# Patient Record
Sex: Female | Born: 1975 | Race: White | Hispanic: No | Marital: Married | Smoking: Current every day smoker
Health system: Southern US, Community
[De-identification: ages and names within clinical notes are randomized; demographics above are authoritative.]

## PROBLEM LIST (undated history)

## (undated) DIAGNOSIS — N83209 Unspecified ovarian cyst, unspecified side: Secondary | ICD-10-CM

## (undated) DIAGNOSIS — D649 Anemia, unspecified: Secondary | ICD-10-CM

## (undated) HISTORY — PX: ECTOPIC PREGNANCY SURGERY: SHX613

---

## 2008-03-05 ENCOUNTER — Inpatient Hospital Stay (HOSPITAL_COMMUNITY): Admission: AD | Admit: 2008-03-05 | Discharge: 2008-03-06 | Payer: Self-pay | Admitting: Obstetrics and Gynecology

## 2008-03-05 ENCOUNTER — Encounter: Payer: Self-pay | Admitting: Emergency Medicine

## 2008-03-05 ENCOUNTER — Ambulatory Visit: Payer: Self-pay | Admitting: Obstetrics and Gynecology

## 2008-03-05 ENCOUNTER — Ambulatory Visit: Payer: Self-pay | Admitting: Diagnostic Radiology

## 2008-06-19 ENCOUNTER — Emergency Department (HOSPITAL_BASED_OUTPATIENT_CLINIC_OR_DEPARTMENT_OTHER): Admission: EM | Admit: 2008-06-19 | Discharge: 2008-06-19 | Payer: Self-pay | Admitting: Emergency Medicine

## 2008-06-19 ENCOUNTER — Ambulatory Visit: Payer: Self-pay | Admitting: Diagnostic Radiology

## 2010-04-22 LAB — COMPREHENSIVE METABOLIC PANEL
ALT: 12 U/L (ref 0–35)
AST: 32 U/L (ref 0–37)
Albumin: 4.4 g/dL (ref 3.5–5.2)
Alkaline Phosphatase: 84 U/L (ref 39–117)
BUN: 14 mg/dL (ref 6–23)
CO2: 24 mEq/L (ref 19–32)
Calcium: 9.6 mg/dL (ref 8.4–10.5)
Chloride: 105 mEq/L (ref 96–112)
Creatinine, Ser: 0.7 mg/dL (ref 0.4–1.2)
GFR calc Af Amer: >60 mL/min (ref >60)
GFR calc non Af Amer: >60 mL/min (ref >60)
Glucose, Bld: 87 mg/dL (ref 70–99)
Potassium: 4.4 mEq/L (ref 3.5–5.1)
Sodium: 141 mEq/L (ref 135–145)
Total Bilirubin: 0.4 mg/dL (ref 0.3–1.2)
Total Protein: 8.1 g/dL (ref 6.0–8.3)

## 2010-04-22 LAB — APTT: aPTT: 33 seconds (ref 24–37)

## 2010-04-22 LAB — URINALYSIS, ROUTINE W REFLEX MICROSCOPIC
Leukocytes, UA: NEGATIVE
Nitrite: NEGATIVE
Specific Gravity, Urine: 1.004 — ABNORMAL LOW (ref 1.005–1.030)
Urobilinogen, UA: 0.2 mg/dL (ref 0.0–1.0)
pH: 7 (ref 5.0–8.0)

## 2010-04-22 LAB — PREGNANCY, URINE: Preg Test, Ur: NEGATIVE

## 2010-04-22 LAB — POCT CARDIAC MARKERS
Myoglobin, poc: 10.4 ng/mL — ABNORMAL LOW (ref 12–200)
Troponin i, poc: <0.05 ng/mL (ref 0.00–0.09)

## 2010-04-22 LAB — DIFFERENTIAL
Basophils Absolute: 0.3 10*3/uL — ABNORMAL HIGH (ref 0.0–0.1)
Basophils Relative: 2 % — ABNORMAL HIGH (ref 0–1)
Monocytes Relative: 7 % (ref 3–12)
Neutro Abs: 7.9 10*3/uL — ABNORMAL HIGH (ref 1.7–7.7)
Neutrophils Relative %: 60 % (ref 43–77)

## 2010-04-22 LAB — CBC
MCHC: 34.1 g/dL (ref 30.0–36.0)
Platelets: 232 10*3/uL (ref 150–400)
RBC: 4.97 MIL/uL (ref 3.87–5.11)
RDW: 18.6 % — ABNORMAL HIGH (ref 11.5–15.5)

## 2010-04-22 LAB — URINE CULTURE: Colony Count: 5000

## 2010-04-22 LAB — URINE MICROSCOPIC-ADD ON

## 2010-04-30 LAB — DIFFERENTIAL
Basophils Absolute: 0 10*3/uL (ref 0.0–0.1)
Eosinophils Absolute: 0.1 10*3/uL (ref 0.0–0.7)
Lymphocytes Relative: 23 % (ref 12–46)
Monocytes Relative: 8 % (ref 3–12)
Neutrophils Relative %: 68 % (ref 43–77)

## 2010-04-30 LAB — COMPREHENSIVE METABOLIC PANEL
Albumin: 4.1 g/dL (ref 3.5–5.2)
Alkaline Phosphatase: 74 U/L (ref 39–117)
BUN: 10 mg/dL (ref 6–23)
CO2: 26 mEq/L (ref 19–32)
Chloride: 107 mEq/L (ref 96–112)
Creatinine, Ser: 0.6 mg/dL (ref 0.4–1.2)
GFR calc non Af Amer: >60 mL/min (ref >60)
Glucose, Bld: 94 mg/dL (ref 70–99)
Potassium: 3.6 mEq/L (ref 3.5–5.1)
Total Bilirubin: 0.4 mg/dL (ref 0.3–1.2)

## 2010-04-30 LAB — RETICULOCYTES
RBC.: 2.99 MIL/uL — ABNORMAL LOW (ref 3.87–5.11)
Retic Count, Absolute: 47.8 10*3/uL (ref 19.0–186.0)
Retic Ct Pct: 1.6 % (ref 0.4–3.1)

## 2010-04-30 LAB — D-DIMER, QUANTITATIVE: D-Dimer, Quant: 0.22 ug/mL-FEU (ref 0.00–0.48)

## 2010-04-30 LAB — CBC
HCT: 21.8 % — ABNORMAL LOW (ref 36.0–46.0)
Hemoglobin: 6.3 g/dL — CL (ref 12.0–15.0)
MCV: 69.3 fL — ABNORMAL LOW (ref 78.0–100.0)
Platelets: 273 10*3/uL (ref 150–400)
WBC: 12 10*3/uL — ABNORMAL HIGH (ref 4.0–10.5)

## 2010-04-30 LAB — URINALYSIS, ROUTINE W REFLEX MICROSCOPIC
Glucose, UA: NEGATIVE mg/dL
Ketones, ur: 15 mg/dL — AB
Leukocytes, UA: NEGATIVE
Protein, ur: 30 mg/dL — AB
Urobilinogen, UA: 1 mg/dL (ref 0.0–1.0)

## 2010-04-30 LAB — VITAMIN B12: Vitamin B-12: 360 pg/mL (ref 211–911)

## 2010-04-30 LAB — GC/CHLAMYDIA PROBE AMP, GENITAL: GC Probe Amp, Genital: NEGATIVE

## 2010-04-30 LAB — IRON AND TIBC
Iron: <10 ug/dL — ABNORMAL LOW (ref 42–135)
UIBC: 404 ug/dL

## 2010-04-30 LAB — URINE MICROSCOPIC-ADD ON

## 2010-04-30 LAB — WET PREP, GENITAL
Clue Cells Wet Prep HPF POC: NONE SEEN
Trich, Wet Prep: NONE SEEN
Yeast Wet Prep HPF POC: NONE SEEN

## 2010-10-08 IMAGING — US US TRANSVAGINAL NON-OB
2 series · 14 of 25 positions shown · non-contrast
Comparison: None

CLINICAL DATA: Vaginal bleeding.

TRANSABDOMINAL AND TRANSVAGINAL ULTRASOUND OF PELVIS
TECHNIQUE: Both transabdominal and transvaginal ultrasound
examinations of the pelvis were performed including evaluation of
the uterus, ovaries, adnexal regions, and pelvic cul-de-sac.

[Series 1: us transvaginal non-ob · 13 of 47 slices shown (1 of 2)]
[im 1/47]
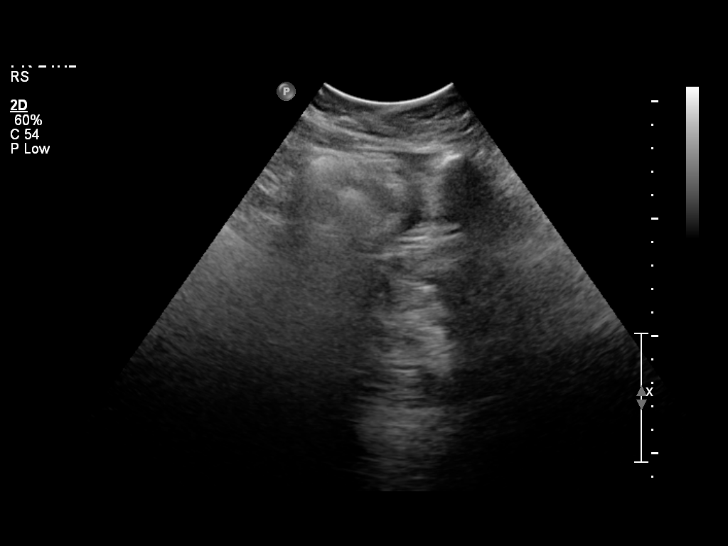
[im 5/47]
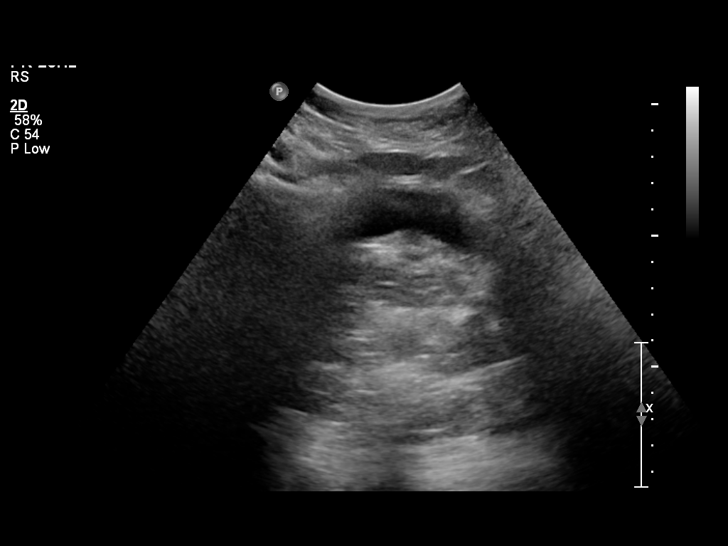
[im 9/47]
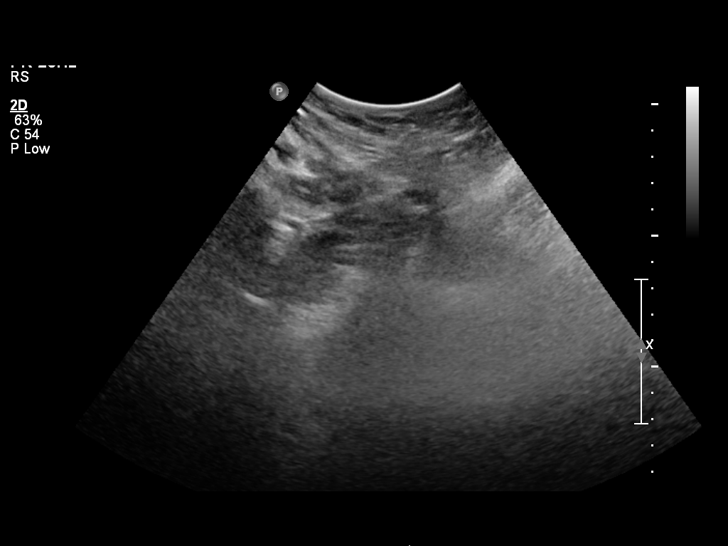
[im 13/47]
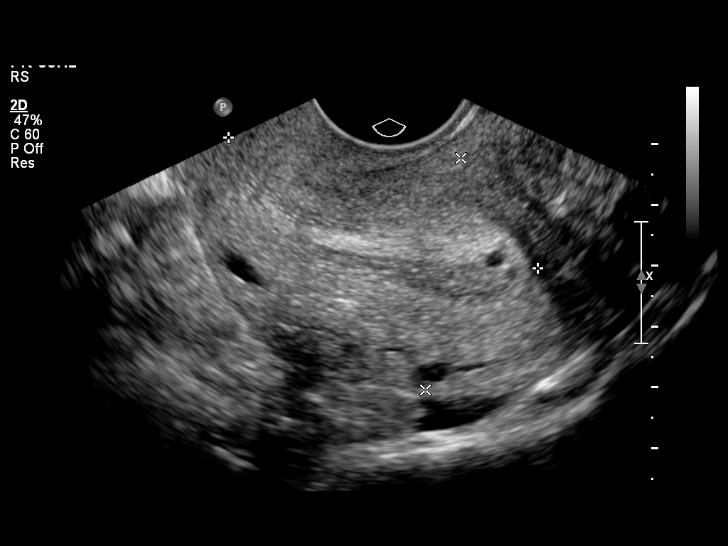
[im 17/47]
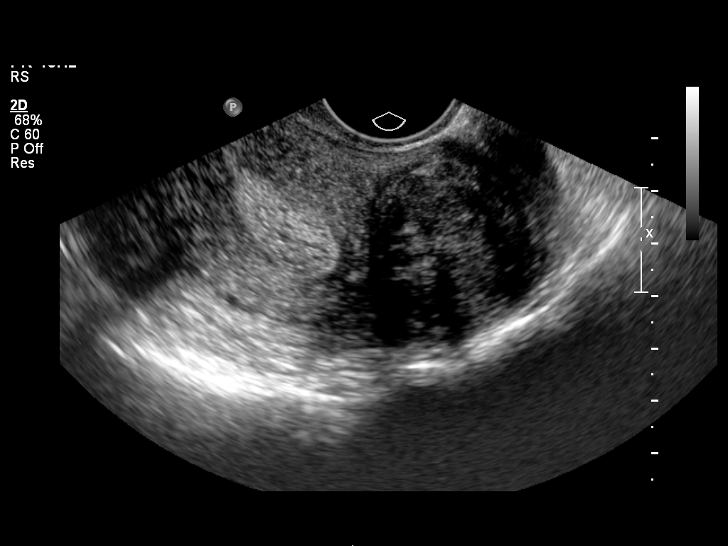
[im 19/47]
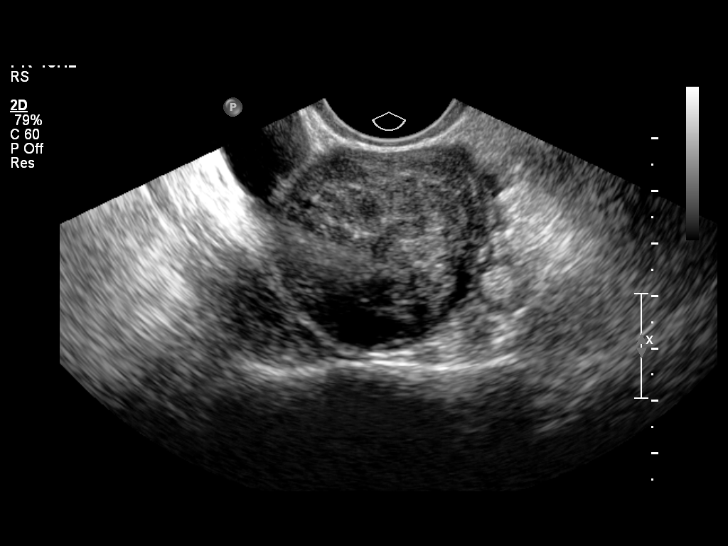
[im 23/47]
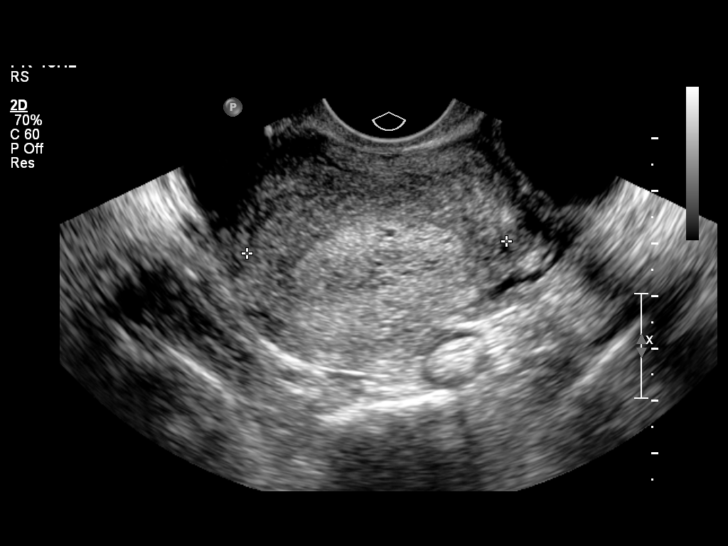
[im 27/47]
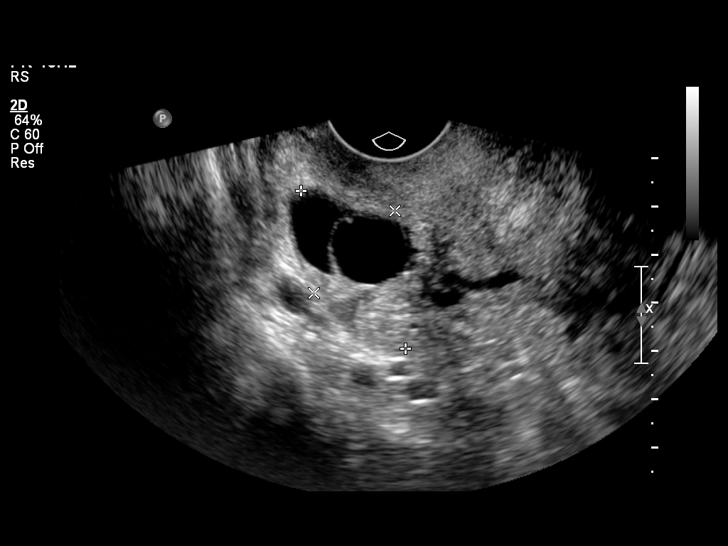
[im 31/47]
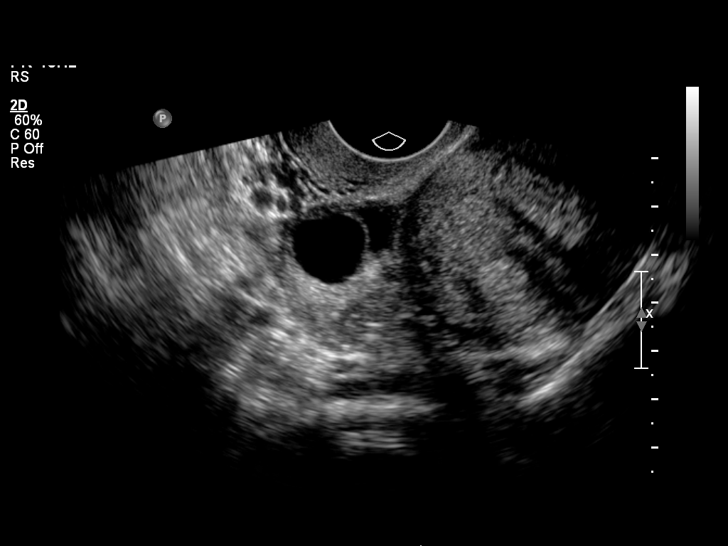
[im 33/47]
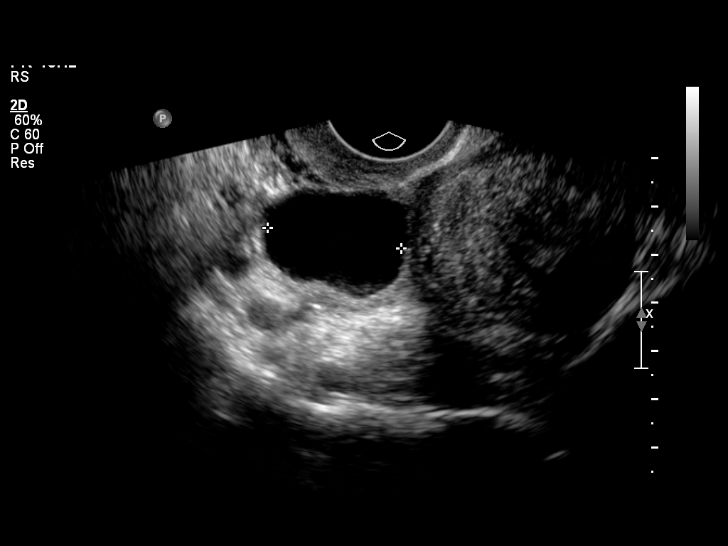
[im 37/47]
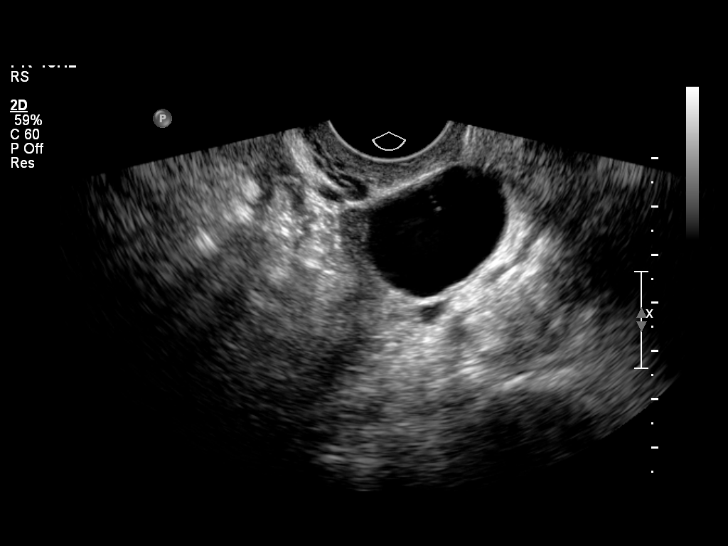
[im 41/47]
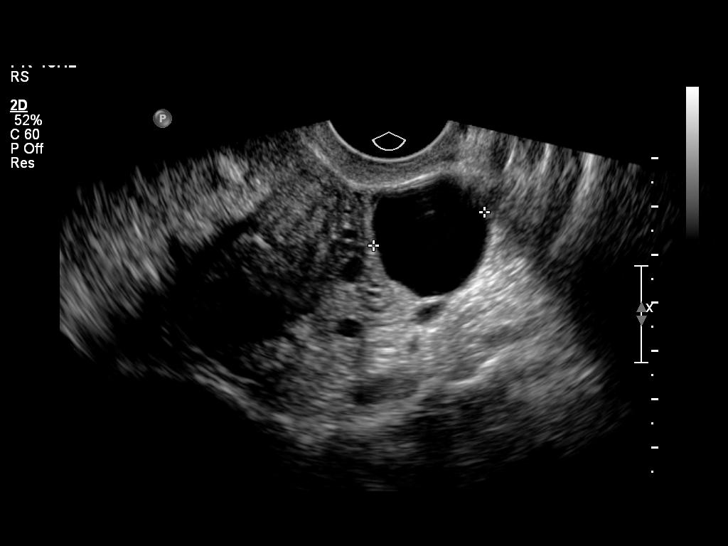
[im 45/47]
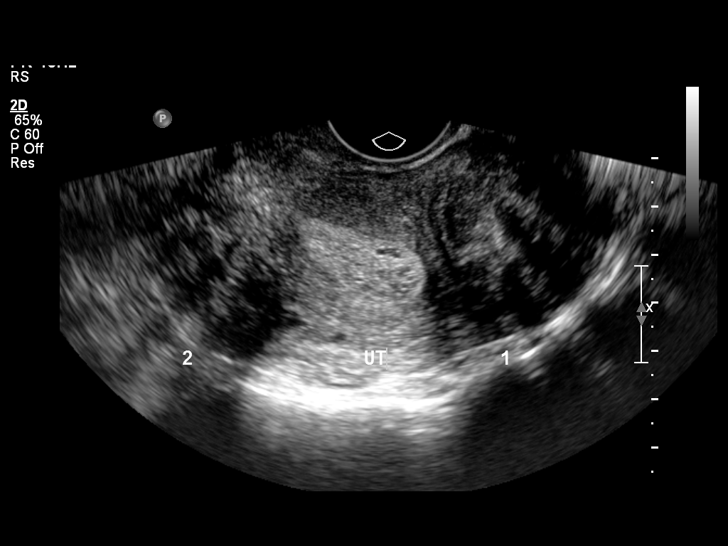

[Series 4: us transvaginal non-ob · 1 of 256 frames shown (2 of 2)]
[frame 129/256]
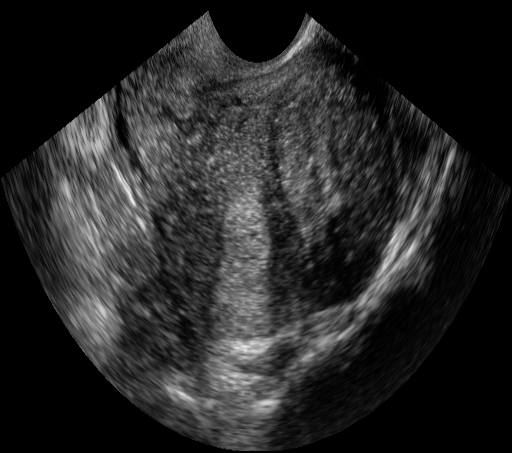

[14 of 25 positions shown; findings below may reference images not displayed]

FINDINGS: The uterus measures 7.1 x 4.7 x 4.9 cm.  There are two
dominant fibroids, one at the fundus measuring 3.4 x 3.2 x 3.4 cm
and one in the lower uterine segment measuring 3.6 x 2.4 x 3.0 cm.
The endometrium is slightly thickened at 9.8 mm.

The right ovary measures 3.9 x 2.4 x 2.4 cm and contains a 2.4 x
1.4 x 2.8 cm simple cyst.  The left ovary measures 4.5 x 2.5 x
cm and contains a 2.7 x 3.0 x 2.4 cm simple cyst.  There is no free
fluid.
IMPRESSION: Two leiomyomas of the uterus.  Slightly thickened and heterogeneous
endometrium.

Dominant ovarian cyst on each side.

No free fluid.

## 2011-01-22 IMAGING — CR DG CHEST 2V
2 series · 2 of 2 positions shown · non-contrast
Comparison: 03/05/2008

CLINICAL DATA: Chest pain shortness of breath.

CHEST - 2 VIEW

[w chest pa]
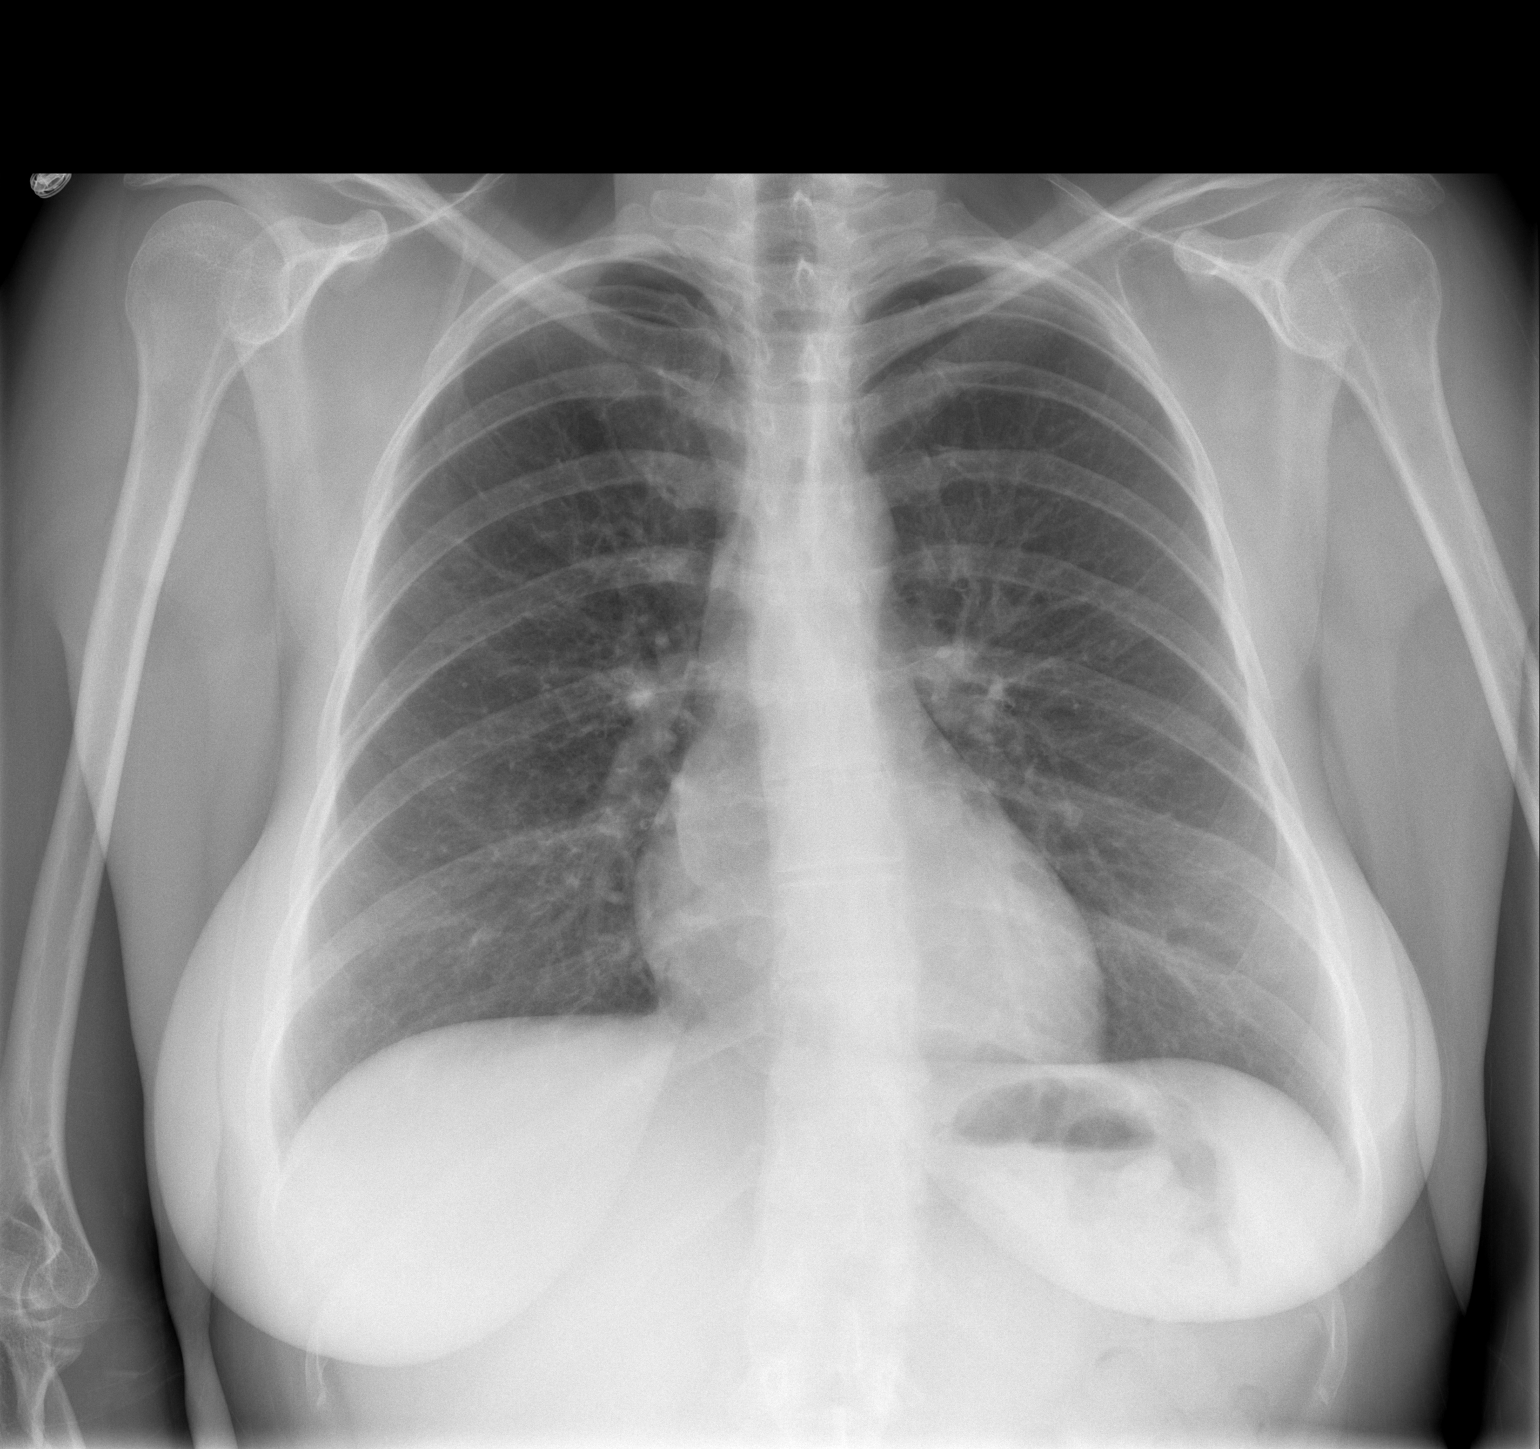

[w chest lat]
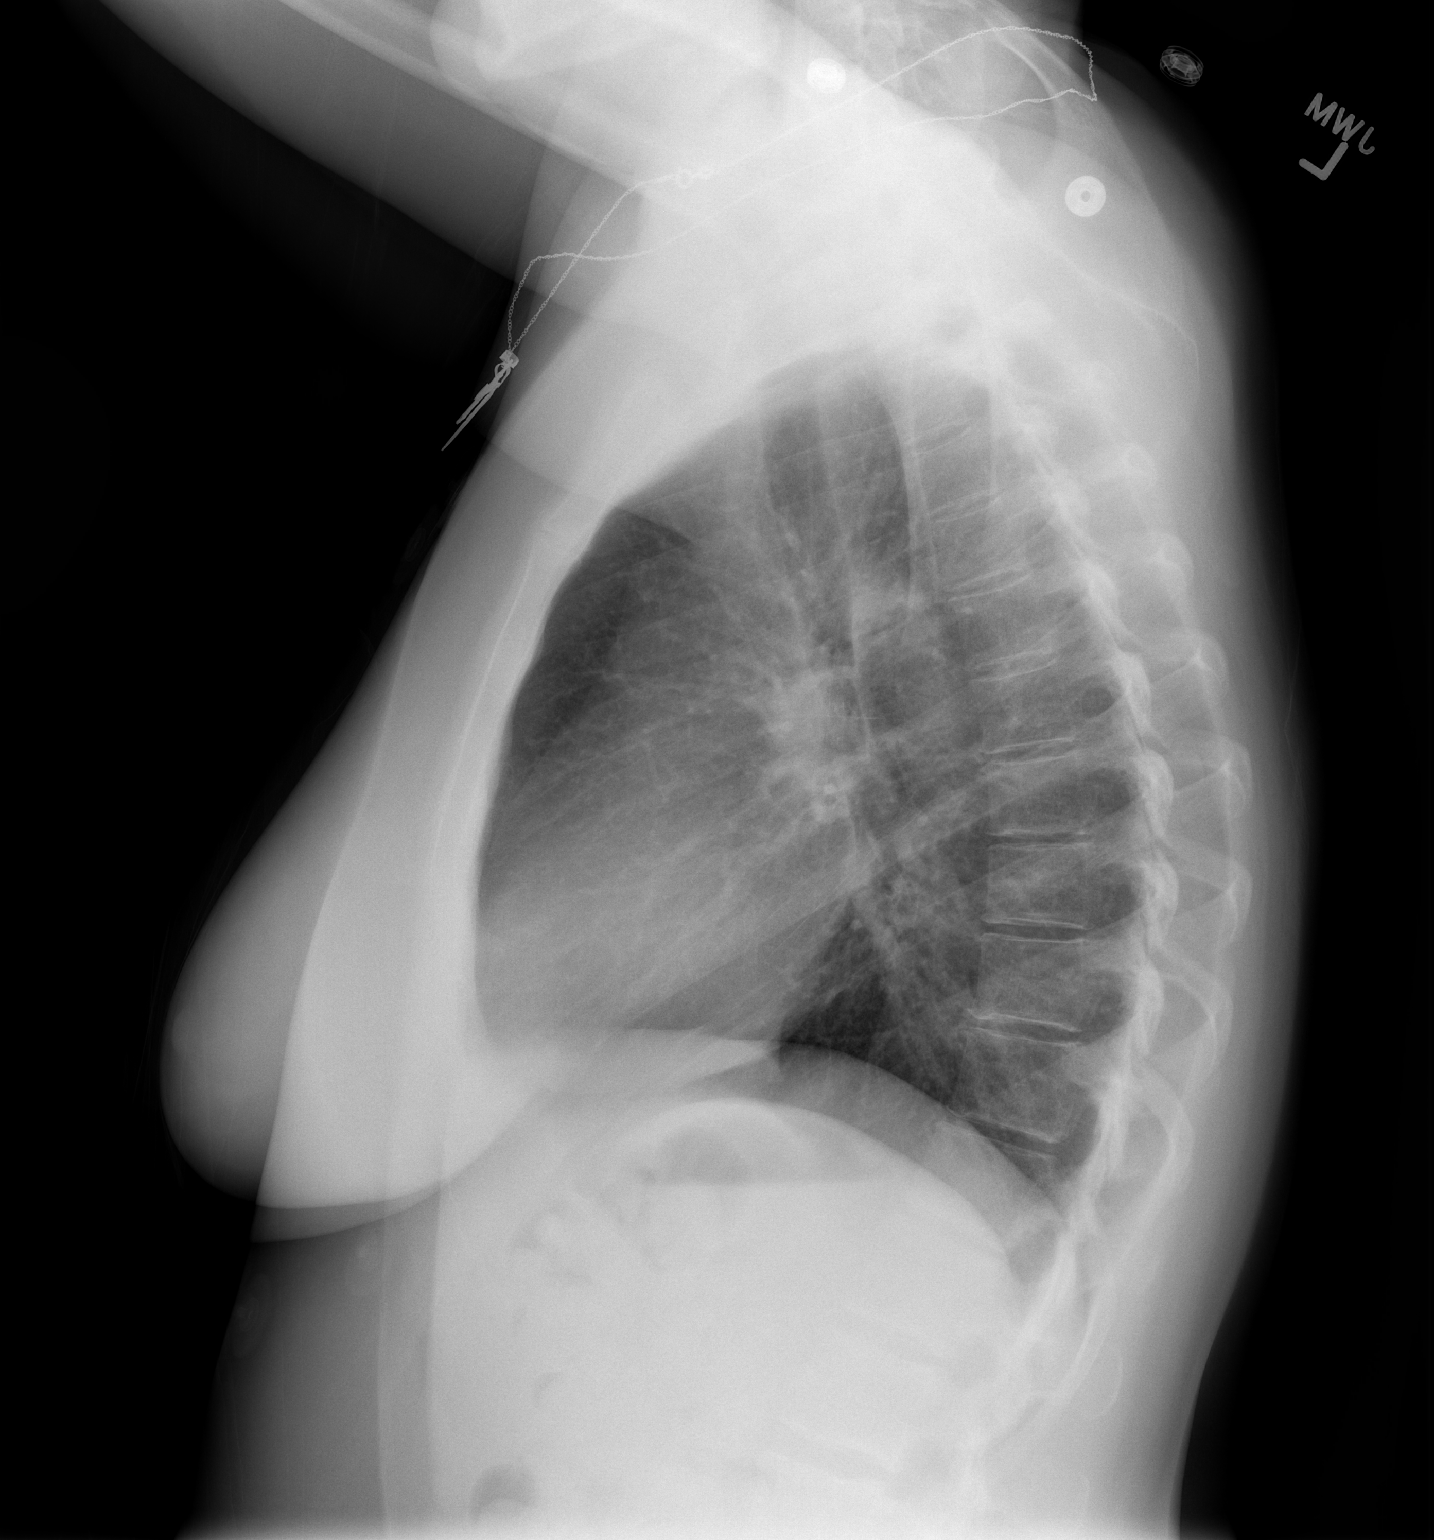

[2 of 2 positions shown; findings below may reference images not displayed]

FINDINGS: The cardiac silhouette, mediastinal and hilar contours
are within normal limits and stable. The lungs are clear.  No
pleural effusions. The bony thorax is intact.
IMPRESSION: No acute cardiopulmonary findings.

## 2013-11-09 ENCOUNTER — Emergency Department (HOSPITAL_BASED_OUTPATIENT_CLINIC_OR_DEPARTMENT_OTHER)
Admission: EM | Admit: 2013-11-09 | Discharge: 2013-11-09 | Disposition: A | Discharge status: Home / Self Care | Payer: 59 | Attending: Emergency Medicine | Admitting: Emergency Medicine

## 2013-11-09 ENCOUNTER — Encounter (HOSPITAL_BASED_OUTPATIENT_CLINIC_OR_DEPARTMENT_OTHER): Payer: Self-pay | Admitting: Emergency Medicine

## 2013-11-09 DIAGNOSIS — Z862 Personal history of diseases of the blood and blood-forming organs and certain disorders involving the immune mechanism: Secondary | ICD-10-CM | POA: Diagnosis not present

## 2013-11-09 DIAGNOSIS — Z720 Tobacco use: Secondary | ICD-10-CM | POA: Insufficient documentation

## 2013-11-09 DIAGNOSIS — Z8742 Personal history of other diseases of the female genital tract: Secondary | ICD-10-CM | POA: Insufficient documentation

## 2013-11-09 DIAGNOSIS — H9202 Otalgia, left ear: Secondary | ICD-10-CM | POA: Diagnosis present

## 2013-11-09 HISTORY — DX: Anemia, unspecified: D64.9

## 2013-11-09 HISTORY — DX: Unspecified ovarian cyst, unspecified side: N83.209

## 2013-11-09 MED ORDER — FLUTICASONE PROPIONATE 50 MCG/ACT NA SUSP: 2.0000 | Freq: Every day | NASAL | Status: AC

## 2013-11-09 MED ORDER — CIPROFLOXACIN-HYDROCORTISONE 0.2-1 % OT SUSP: 3.0000 [drp] | Freq: Two times a day (BID) | OTIC | Status: AC

## 2013-11-09 MED ORDER — IBUPROFEN 800 MG PO TABS
800.0000 mg | ORAL_TABLET | Freq: Once | ORAL | Status: AC
  Administered 2013-11-09: 800 mg via ORAL
  Filled 2013-11-09: qty 1

## 2013-11-09 MED ORDER — IBUPROFEN 800 MG PO TABS: 800.0000 mg | ORAL_TABLET | Freq: Three times a day (TID) | ORAL | Status: AC

## 2013-11-09 NOTE — ED Provider Notes (Signed)
CSN: [id number]     Arrival date & time 11/09/13  2152 History  This chart was scribed for Deliana Smitty CordsK Jarmarcus Wambold-Rasch, MD by Gwenyth Oberatherine Macek, ED Scribe. This patient was seen in room MH03/MH03 and the patient's care was started at 11:20 PM.     Chief Complaint  Patient presents with  . Otalgia   Patient is a 38 y.o. female presenting with ear pain. The history is provided by the patient. No language interpreter was used.  Otalgia Location:  Left Behind ear:  No abnormality Quality:  Aching Onset quality:  Gradual Duration:  1 week Timing:  Constant Progression:  Unchanged Chronicity:  New Context: not foreign body in ear   Relieved by:  Nothing Exacerbated by: QTips and ear candle. Ineffective treatments: ear candle. Associated symptoms: congestion   Associated symptoms: no cough, no fever, no headaches, no hearing loss, no rhinorrhea, no sore throat and no tinnitus   Risk factors: no recent travel    HPI Comments: Zohar Jennye MoccasinCockerham is a 38 y.o. female who presents to the Emergency Department complaining of left ear pain and fullness that started 1 week ago. She states decreased hearing, loss of balance, and congestion as associated symptoms. She has tried an ear candle and Tylenol with no relief to symptoms. Pt used Q-tips 3-4 days ago to clean ears. Pt denies similar symptoms in her right ear pain.  Pt denies fever and cough as associated symptoms.   Past Medical History  Diagnosis Date  . Anemia   . Ovarian cyst rupture    Past Surgical History  Procedure Laterality Date  . Ectopic pregnancy surgery     No family history on file. History  Substance Use Topics  . Smoking status: Current Every Day Smoker  . Smokeless tobacco: Not on file  . Alcohol Use: No   OB History   Grav Para Term Preterm Abortions TAB SAB Ect Mult Living                 Review of Systems  Constitutional: Negative for fever.  HENT: Positive for congestion and ear pain. Negative for hearing loss,  rhinorrhea, sore throat and tinnitus.   Respiratory: Negative for cough.   Neurological: Negative for headaches.  All other systems reviewed and are negative.   Allergies  Bee pollen  Home Medications   Prior to Admission medications   Not on File   BP 147/89  Pulse 76  Temp(Src) 97.8 F (36.6 C) (Oral)  Resp 20  Ht 4\' 11"  (1.499 m)  Wt 179 lb (81.194 kg)  BMI 36.13 kg/m2  SpO2 95% Physical Exam  Nursing note and vitals reviewed. Constitutional: She is oriented to person, place, and time. She appears well-developed and well-nourished. No distress.  HENT:  Head: Normocephalic and atraumatic.  Right Ear: No mastoid tenderness. Tympanic membrane is not injected.  Left Ear: No mastoid tenderness. No hemotympanum.  Ears:  Mouth/Throat: Oropharynx is clear and moist. No oropharyngeal exudate.  Scratch in right and left canals. Retracted left ear drum and trauma to left ear drum.  Eyes: Conjunctivae and EOM are normal. Pupils are equal, round, and reactive to light.  Neck: Normal range of motion. Neck supple.  Cardiovascular: Normal rate, regular rhythm and normal heart sounds.   Pulmonary/Chest: Effort normal and breath sounds normal. No stridor.  Abdominal: Soft. Bowel sounds are normal. There is no tenderness.  Musculoskeletal: She exhibits no edema.  Lymphadenopathy:    She has no cervical adenopathy.  Neurological: She  is alert and oriented to person, place, and time. No cranial nerve deficit.  Skin: Skin is warm and dry. No rash noted.  Psychiatric: She has a normal mood and affect. Her behavior is normal.    ED Course  Procedures (including critical care time) DIAGNOSTIC STUDIES: Oxygen Saturation is 95% on RA, adequate by my interpretation.    COORDINATION OF CARE: 11:24 PM Discussed treatment plan with pt at bedside and pt agreed to plan.   Labs Review Labs Reviewed - No data to display  Imaging Review No results found.   EKG Interpretation None       MDM   Final diagnoses:  None    No Qtips in the ear.  Flonase to Decongest Cipro ear drops in left ear close follow up with ENT  I personally performed the services described in this documentation, which was scribed in my presence. The recorded information has been reviewed and is accurate.     Jasmine AweApril K Jeoffrey Eleazer-Rasch, MD 11/10/13 [phone]

## 2013-11-09 NOTE — Discharge Instructions (Signed)
Ear Drops °You have been diagnosed with a condition requiring you to put drops of medicine into your outer ear. °HOME CARE INSTRUCTIONS  °· Put drops in the affected ear as instructed. After putting the drops in, you will need to lie down with the affected ear facing up for ten minutes so the drops will remain in the ear canal and run down and fill the canal. Continue using the ear drops for as long as directed by your health care provider. °· Prior to getting up, put a cotton ball gently in your ear canal. Leave enough of the cotton ball out so it can be easily removed. Do not attempt to push this down into the canal with a cotton-tipped swab or other instrument. °· Do not irrigate or wash out your ears if you have had a perforated eardrum or mastoid surgery, or unless instructed to do so by your health care provider. °· Keep appointments with your health care provider as instructed. °· Finish all medicine, or use for the length of time prescribed by your health care provider. Continue the drops even if your problem seems to be doing well after a couple days, or continue as instructed. °SEEK MEDICAL CARE IF: °· You become worse or develop increasing pain. °· You notice any unusual drainage from your ear (particularly if the drainage has a bad smell). °· You develop hearing difficulties. °· You experience a serious form of dizziness in which you feel as if the room is spinning, and you feel nauseated (vertigo). °· The outside of your ear becomes red or swollen or both. This may be a sign of an allergic reaction. °MAKE SURE YOU:  °· Understand these instructions. °· Will watch your condition. °· Will get help right away if you are not doing well or get worse. °Document Released: 12/24/2000 Document Revised: 01/04/2013 Document Reviewed: 07/27/2012 °[employer]® Patient Information ©2015 [employer]. This information is not intended to replace advice given to you by your health care provider. Make sure you discuss any  questions you have with your health care provider. ° °

## 2013-11-09 NOTE — ED Notes (Signed)
Pt c/o left ear pressure w pulsating x 1 week  Worse tonight w dizziness onset this pm   Ear feels full

## 2013-11-09 NOTE — ED Notes (Signed)
C/o left earache x 1 week with decreased hearing

## 2013-11-10 ENCOUNTER — Encounter (HOSPITAL_BASED_OUTPATIENT_CLINIC_OR_DEPARTMENT_OTHER): Payer: Self-pay | Admitting: Emergency Medicine

## 2013-12-01 ENCOUNTER — Ambulatory Visit (INDEPENDENT_AMBULATORY_CARE_PROVIDER_SITE_OTHER): Payer: PRIVATE HEALTH INSURANCE | Admitting: Otolaryngology

## 2016-04-09 ENCOUNTER — Ambulatory Visit: Payer: Self-pay | Admitting: Podiatry
